# Patient Record
Sex: Female | Born: 1960 | Race: White | Hispanic: No | Marital: Single | Smoking: Never smoker
Health system: Southern US, Community
[De-identification: ages and names within clinical notes are randomized; demographics above are authoritative.]

## PROBLEM LIST (undated history)

## (undated) HISTORY — PX: BREAST SURGERY: SHX581

---

## 2013-10-09 ENCOUNTER — Emergency Department
Admission: EM | Admit: 2013-10-09 | Discharge: 2013-10-09 | Disposition: A | Discharge status: Home / Self Care | Payer: BC Managed Care – PPO | Source: Home / Self Care | Attending: Family Medicine | Admitting: Family Medicine

## 2013-10-09 ENCOUNTER — Encounter: Payer: Self-pay | Admitting: Emergency Medicine

## 2013-10-09 ENCOUNTER — Emergency Department (INDEPENDENT_AMBULATORY_CARE_PROVIDER_SITE_OTHER): Payer: BC Managed Care – PPO

## 2013-10-09 DIAGNOSIS — M545 Low back pain, unspecified: Secondary | ICD-10-CM

## 2013-10-09 DIAGNOSIS — M533 Sacrococcygeal disorders, not elsewhere classified: Secondary | ICD-10-CM

## 2013-10-09 LAB — POCT URINALYSIS DIP (MANUAL ENTRY)
BILIRUBIN UA: NEGATIVE
Glucose, UA: NEGATIVE
Ketones, POC UA: NEGATIVE
NITRITE UA: NEGATIVE
PH UA: 5.5 (ref 5–8)
PROTEIN UA: NEGATIVE
RBC UA: NEGATIVE
Spec Grav, UA: 1.01 (ref 1.005–1.03)
Urobilinogen, UA: 0.2 (ref 0–1)

## 2013-10-09 MED ORDER — METAXALONE 800 MG PO TABS: 800.0000 mg | ORAL_TABLET | Freq: Three times a day (TID) | ORAL | Status: DC

## 2013-10-09 MED ORDER — MELOXICAM 15 MG PO TABS: 15.0000 mg | ORAL_TABLET | Freq: Every day | ORAL | Status: DC

## 2013-10-09 NOTE — ED Notes (Signed)
States had sudden onset pain across lower back last evening; slept well; awoke with more intense back pain in area. Took ibuprofen today and used heat without relief.

## 2013-10-09 NOTE — ED Provider Notes (Signed)
CSN: [id number]     Arrival date & time 10/09/13  1412 History   First MD Initiated Contact with Patient 10/09/13 1438     Chief Complaint  Patient presents with  . Back Pain      HPI Comments: Patient noticed pain in her left lower back at about 6pm yesterday, which has become worse today.  The pain does not radiate.  She does not recall any injury or change in physical activities.  No bowel or bladder dysfunction.  No saddle numbness.  The pain is worse when supine and standing.  Patient is a 53 y.o. female presenting with back pain. The history is provided by the patient.  Back Pain Location:  Sacro-iliac joint Quality:  Aching Radiates to:  Does not radiate Pain severity:  Moderate Pain is:  Same all the time Onset quality:  Sudden Duration:  1 day Timing:  Constant Progression:  Worsening Chronicity:  New Relieved by:  Nothing Worsened by:  Standing and lying down Ineffective treatments:  NSAIDs Associated symptoms: no abdominal pain, no abdominal swelling, no bladder incontinence, no bowel incontinence, no dysuria, no fever, no leg pain, no numbness, no paresthesias, no pelvic pain, no perianal numbness, no tingling and no weakness   Risk factors: obesity     History reviewed. No pertinent past medical history. Past Surgical History  Procedure Laterality Date  . Breast surgery Left     benign tumor   Family History  Problem Relation Age of Onset  . Hyperlipidemia Mother    History  Substance Use Topics  . Smoking status: Never Smoker   . Smokeless tobacco: Not on file  . Alcohol Use: No   OB History   Grav Para Term Preterm Abortions TAB SAB Ect Mult Living                 Review of Systems  Constitutional: Negative for fever.  Gastrointestinal: Negative for abdominal pain and bowel incontinence.  Genitourinary: Negative for bladder incontinence, dysuria and pelvic pain.  Musculoskeletal: Positive for back pain.  Neurological: Negative for tingling,  weakness, numbness and paresthesias.    Allergies  Sulfa antibiotics  Home Medications   Current Outpatient Rx  Name  Route  Sig  Dispense  Refill  . meloxicam (MOBIC) 15 MG tablet   Oral   Take 1 tablet (15 mg total) by mouth daily. Take with food each morning   15 tablet   0   . metaxalone (SKELAXIN) 800 MG tablet   Oral   Take 1 tablet (800 mg total) by mouth 3 (three) times daily.   21 tablet   0    BP 106/76  Pulse 89  Temp(Src) 98.2 F (36.8 C) (Oral)  Resp 16  Ht 5\' 1"  (1.549 m)  Wt 172 lb (78.019 kg)  BMI 32.52 kg/m2  SpO2 98% Physical Exam  Nursing note and vitals reviewed. Constitutional: She is oriented to person, place, and time. She appears well-developed and well-nourished. No distress.  Patient is obese (BMI 32.5)  HENT:  Head: Normocephalic and atraumatic.  Eyes: Conjunctivae are normal. Pupils are equal, round, and reactive to light.  Neck: Normal range of motion.  Cardiovascular: Normal heart sounds.   Pulmonary/Chest: Breath sounds normal.  Abdominal: There is no tenderness.  Musculoskeletal:       Lumbar back: She exhibits decreased range of motion, tenderness and bony tenderness. She exhibits no swelling and no edema.       Back:  Back:  Can heel/toe walk and squat without difficulty.  Decreased forward flexion.  Tenderness over the left SI joint.  Straight leg raising test is negative.  Sitting knee extension test is negative.  Strength and sensation in the lower extremities is normal.  Patellar and achilles reflexes are normal   Neurological: She is alert and oriented to person, place, and time.  Skin: Skin is warm and dry. No rash noted.    ED Course  Procedures  none    Labs Reviewed  POCT URINALYSIS DIP (MANUAL ENTRY):  LEU trace, otherwise negative   Imaging Review Dg Lumbar Spine Complete  10/09/2013   CLINICAL DATA:  Right lower back pain and tenderness over the left sacroiliac joint.  EXAM: LUMBAR SPINE - COMPLETE 4+ VIEW   COMPARISON:  None.  FINDINGS: There is no fracture, subluxation, disc space narrowing, facet arthritis or bone destruction. There are minimal degenerative changes of the left sacroiliac joint.  IMPRESSION: Slight degenerative changes of the left sacroiliac joint. No significant abnormality of the lumbar spine.   Electronically Signed   By: Geanie CooleyJim  Maxwell M.D.   On: 10/09/2013 15:35     MDM   1. Pain of left sacroiliac joint    Begin Mobic, and Skelaxin.  Apply ice pack for 20 minutes every 3 to 4 hours.  Continue until pain decreases.  Begin stretching exercises. Followup with [facility] if not improving about two weeks.    Lattie HawStephen A Arjan Strohm, MD 10/10/13 [phone]

## 2013-10-09 NOTE — Discharge Instructions (Signed)
Apply ice pack for 20 minutes every 3 to 4 hours.  Continue until pain decreases.  Begin stretching exercises.   Sacroiliac Joint Dysfunction The sacroiliac joint connects the lower part of the spine (the sacrum) with the bones of the pelvis. CAUSES  Sometimes, there is no obvious reason for sacroiliac joint dysfunction. Other times, it may occur   During pregnancy.  After injury, such as:  Car accidents.  Sport-related injuries.  Work-related injuries.  Due to one leg being shorter than the other.  Due to other conditions that affect the joints, such as:  Rheumatoid arthritis.  Gout.  Psoriasis.  Joint infection (septic arthritis). SYMPTOMS  Symptoms may include:  Pain in the:  Lower back.  Buttocks.  Groin.  Thighs and legs.  Difficult sitting, standing, walking, lying, bending or lifting. DIAGNOSIS  A number of tests may be used to help diagnose the cause of sacroiliac joint dysfunction, including:  Imaging tests to look for other causes of pain, including:  MRI.  CT scan.  Bone scan.  Diagnostic injection: During a special x-ray (called fluoroscopy), a needle is put into the sacroiliac joint. A numbing medicine is injected into the joint. If the pain is improved or stopped, the diagnosis of sacroiliac joint dysfunction is more likely. TREATMENT  There are a number of types of treatment used for sacroiliac joint dysfunction, including:  Only take over-the-counter or prescription medicines for pain, discomfort, or fever as directed by your caregiver.  Medications to relax muscles.  Rest. Decreasing activity can help cut down on painful muscle spasms and allow the back to heal.  Application of heat or ice to the lower back may improve muscle spasms and soothe pain.  Brace. A special back brace, called a sacroiliac belt, can help support the joint while your back is healing.  Physical therapy can help teach comfortable positions and exercises to  strengthen muscles that support the sacroiliac joint.  Cortisone injections. Injections of steroid medicine into the joint can help decrease swelling and improve pain.  Hyaluronic acid injections. This chemical improves lubrication within the sacroiliac joint, thereby decreasing pain.  Radiofrequency ablation. A special needle is placed into the joint, where it burns away nerves that are carrying pain messages from the joint.  Surgery. Because pain occurs during movement of the joint, screws and plates may be installed in order to limit or prevent joint motion. HOME CARE INSTRUCTIONS   Take all medications exactly as directed.  Follow instructions regarding both rest and physical activity, to avoid worsening the pain.  Do physical therapy exercises exactly as prescribed. SEEK IMMEDIATE MEDICAL CARE IF:  You experience increasingly severe pain.  You develop new symptoms, such as numbness or tingling in your legs or feet.  You lose bladder or bowel control. Document Released: 10/05/2008 Document Revised: 10/01/2011 Document Reviewed: 10/05/2008 [facility] Patient Information 2014 [place], [place].

## 2013-10-12 ENCOUNTER — Telehealth: Payer: Self-pay | Admitting: *Deleted

## 2014-11-28 IMAGING — CR DG LUMBAR SPINE COMPLETE 4+V
5 series · 5 of 5 positions shown · non-contrast
Comparison: None.

CLINICAL DATA: Right lower back pain and tenderness over the left
sacroiliac joint.

EXAM:
LUMBAR SPINE - COMPLETE 4+ VIEW

[view not recorded (1 of 5)]
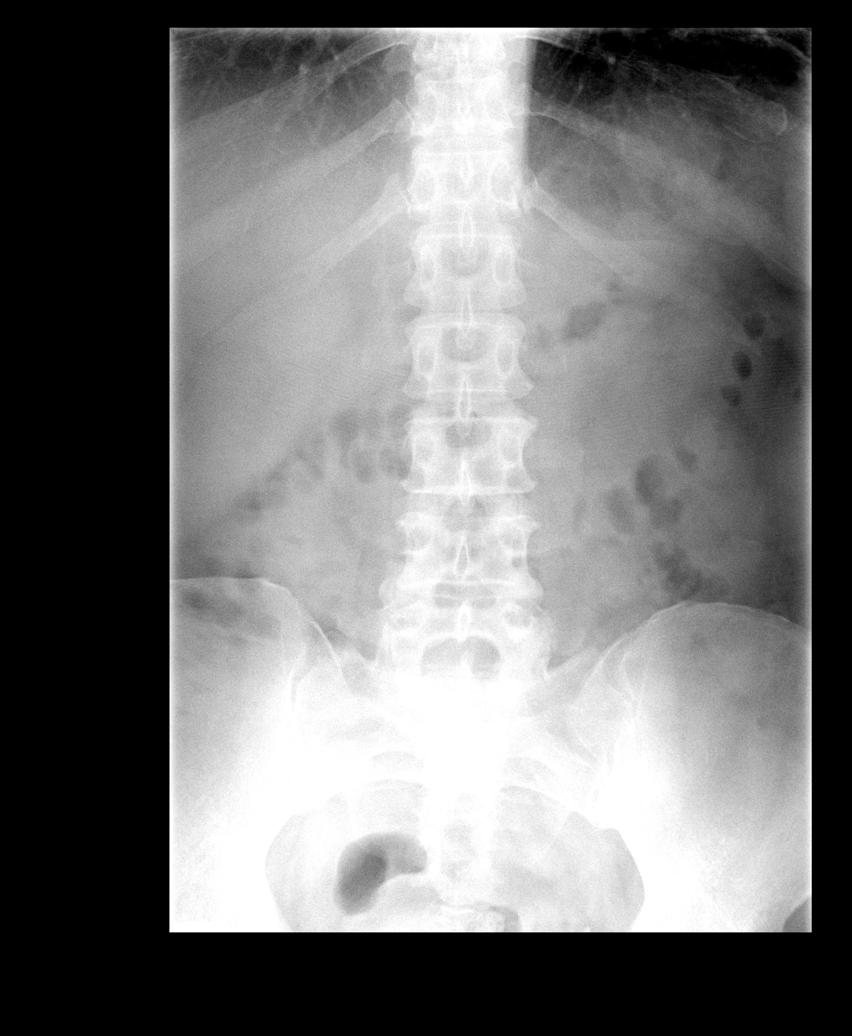

[view not recorded (2 of 5)]
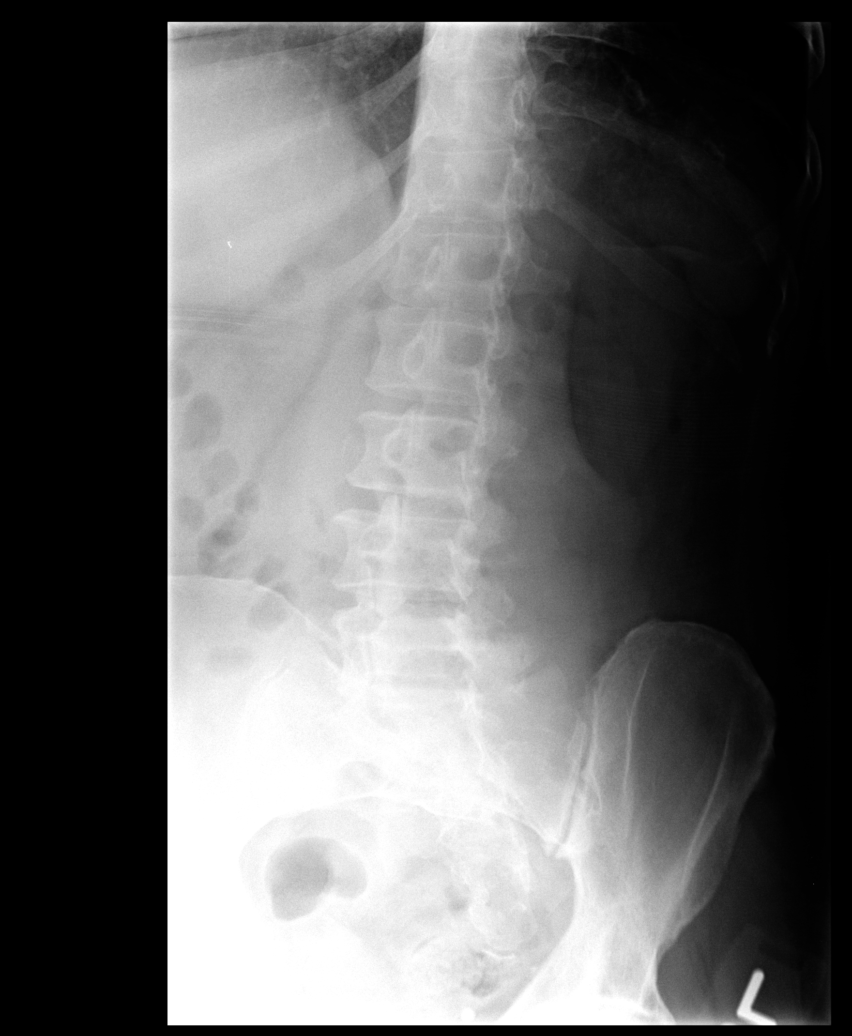

[view not recorded (3 of 5)]
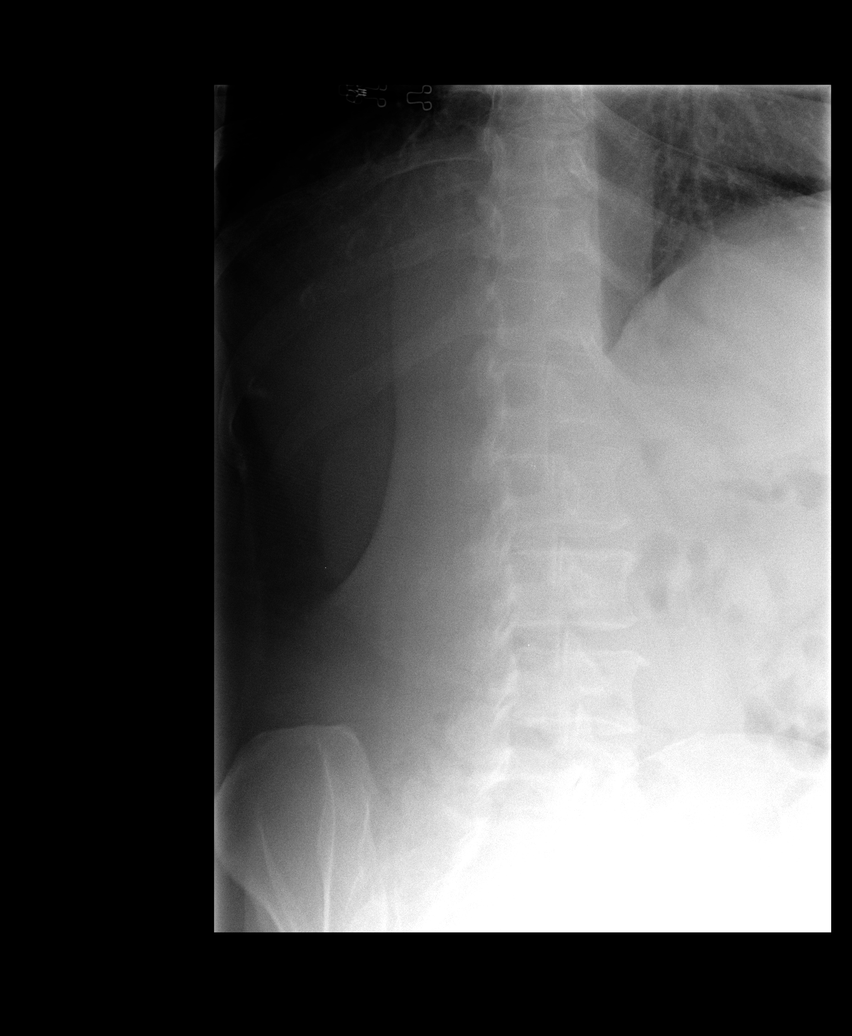

[view not recorded (4 of 5)]
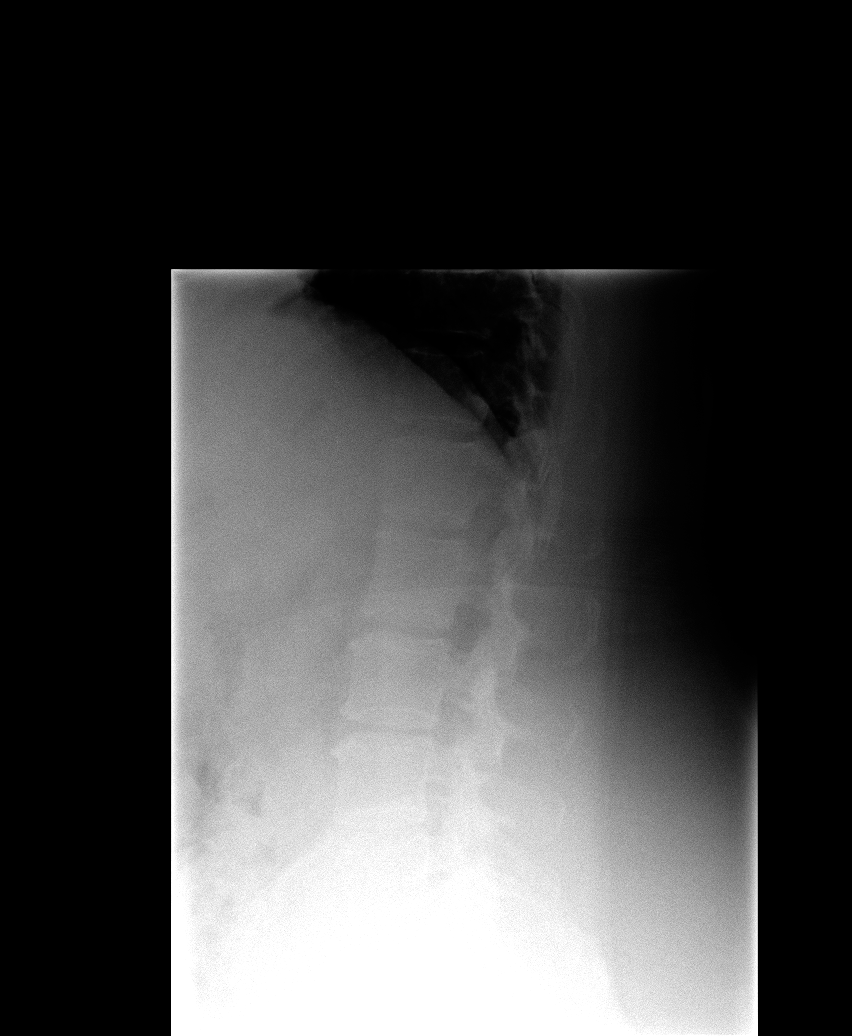

[view not recorded (5 of 5)]
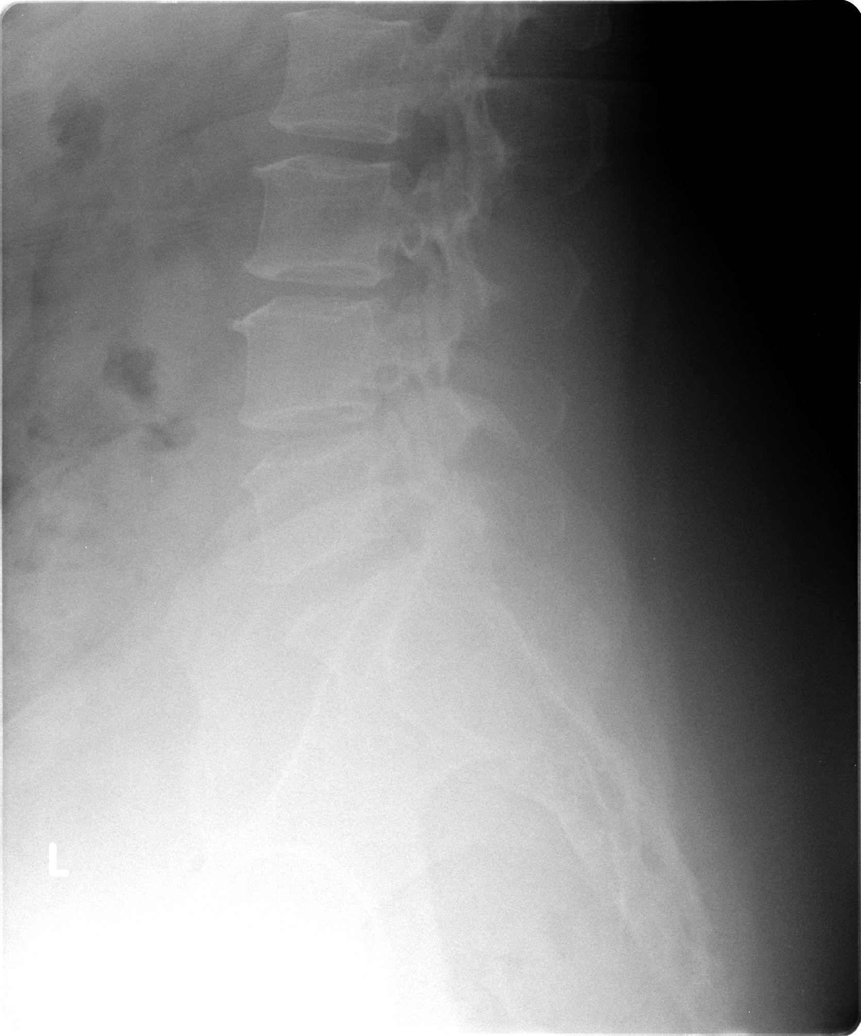

[5 of 5 positions shown; findings below may reference images not displayed]

FINDINGS: There is no fracture, subluxation, disc space narrowing, facet
arthritis or bone destruction. There are minimal degenerative
changes of the left sacroiliac joint.
IMPRESSION: Slight degenerative changes of the left sacroiliac joint. No
significant abnormality of the lumbar spine.

## 2016-07-28 ENCOUNTER — Emergency Department
Admission: EM | Admit: 2016-07-28 | Discharge: 2016-07-28 | Disposition: A | Discharge status: Home / Self Care | Payer: BC Managed Care – PPO | Source: Home / Self Care | Attending: Family Medicine | Admitting: Family Medicine

## 2016-07-28 DIAGNOSIS — J111 Influenza due to unidentified influenza virus with other respiratory manifestations: Secondary | ICD-10-CM

## 2016-07-28 DIAGNOSIS — R69 Illness, unspecified: Secondary | ICD-10-CM | POA: Diagnosis not present

## 2016-07-28 MED ORDER — OSELTAMIVIR PHOSPHATE 75 MG PO CAPS: 75.0000 mg | ORAL_CAPSULE | Freq: Two times a day (BID) | ORAL | 0 refills | Status: DC

## 2016-07-28 MED ORDER — BENZONATATE 200 MG PO CAPS: ORAL_CAPSULE | ORAL | 0 refills | Status: DC

## 2016-07-28 NOTE — Discharge Instructions (Signed)
Take plain guaifenesin (1200mg  extended release tabs such as Mucinex) twice daily, with plenty of water, for cough and congestion.  May add Pseudoephedrine (30mg , one or two every 4 to 6 hours) for sinus congestion.  Get adequate rest.   May use Afrin nasal spray (or generic oxymetazoline) twice daily for about 5 days and then discontinue.  Also recommend using saline nasal spray several times daily and saline nasal irrigation (AYR is a common brand).  Use Flonase nasal spray each morning after using Afrin nasal spray and saline nasal irrigation. Try warm salt water gargles for sore throat.  Stop all antihistamines for now, and other non-prescription cough/cold preparations. May take Ibuprofen 200mg , 3 or 4 tabs every 8 hours with food for fever, headache, body aches, etc.

## 2016-07-28 NOTE — ED Provider Notes (Signed)
[facility]    CSN: [id number] Arrival date & time: 07/28/16  1033     History   Chief Complaint Chief Complaint  Patient presents with  . Generalized Body Aches    HPI Amanda Drake is a 56 y.o. female.   Yesterday patient developed flu-like symptoms including myalgias, fever/chills, fatigue, and cough.  Also has mild nasal congestion and sore throat.  Cough is non-productive and somewhat worse at night.  No pleuritic pain or shortness of breath.    The history is provided by the patient.    History reviewed. No pertinent past medical history.  There are no active problems to display for this patient.   Past Surgical History:  Procedure Laterality Date  . BREAST SURGERY Left    benign tumor    OB History    No data available       Home Medications    Prior to Admission medications   Medication Sig Start Date End Date Taking? Authorizing Provider  benzonatate (TESSALON) 200 MG capsule Take one cap by mouth at bedtime as needed for cough.  May repeat in 4 to 6 hours 07/28/16   Lattie HawStephen A Tyreek Clabo, MD  metaxalone (SKELAXIN) 800 MG tablet Take 1 tablet (800 mg total) by mouth 3 (three) times daily. 10/09/13   Lattie HawStephen A Amdrew Oboyle, MD  oseltamivir (TAMIFLU) 75 MG capsule Take 1 capsule (75 mg total) by mouth every 12 (twelve) hours. 07/28/16   Lattie HawStephen A Bob Daversa, MD    Family History Family History  Problem Relation Age of Onset  . Hyperlipidemia Mother     Social History Social History  Substance Use Topics  . Smoking status: Never Smoker  . Smokeless tobacco: Never Used  . Alcohol use No     Allergies   Sulfa antibiotics   Review of Systems Review of Systems + sore throat + cough No pleuritic pain No wheezing + nasal congestion + post-nasal drainage No sinus pain/pressure No itchy/red eyes ? earache No hemoptysis No SOB + fever, + chills No nausea No vomiting No abdominal pain No diarrhea No urinary symptoms No skin rash +  fatigue + myalgias No headache Used OTC meds without relief   Physical Exam Triage Vital Signs ED Triage Vitals  Enc Vitals Group     BP 07/28/16 1102 134/83     Pulse Rate 07/28/16 1102 118     Resp --      Temp 07/28/16 1102 99.2 F (37.3 C)     Temp Source 07/28/16 1102 Oral     SpO2 07/28/16 1102 95 %     Weight 07/28/16 1103 188 lb (85.3 kg)     Height 07/28/16 1103 5\' 2"  (1.575 m)     Head Circumference --      Peak Flow --      Pain Score 07/28/16 1103 5     Pain Loc --      Pain Edu? --      Excl. in GC? --    No data found.   Updated Vital Signs BP 134/83 (BP Location: Left Arm)   Pulse 118   Temp 99.2 F (37.3 C) (Oral)   Ht 5\' 2"  (1.575 m)   Wt 188 lb (85.3 kg)   SpO2 95%   BMI 34.39 kg/m   Visual Acuity Right Eye Distance:   Left Eye Distance:   Bilateral Distance:    Right Eye Near:   Left Eye Near:    Bilateral Near:  Physical Exam Nursing notes and Vital Signs reviewed. Appearance:  Patient appears stated age, and in no acute distress Eyes:  Pupils are equal, round, and reactive to light and accomodation.  Extraocular movement is intact.  Conjunctivae are not inflamed  Ears:  Canals normal.  Tympanic membranes normal.  Nose:  Mildly congested turbinates.  No sinus tenderness.   Pharynx:  Normal Neck:  Supple.  Tender enlarged posterior/lateral nodes are palpated bilaterally  Lungs:  Clear to auscultation.  Breath sounds are equal.  Moving air well. Heart:  Regular rate and rhythm without murmurs, rubs, or gallops.  Abdomen:  Nontender without masses or hepatosplenomegaly.  Bowel sounds are present.  No CVA or flank tenderness.  Extremities:  No edema.  Skin:  No rash present.    UC Treatments / Results  Labs (all labs ordered are listed, but only abnormal results are displayed) Labs Reviewed - No data to display  EKG  EKG Interpretation None       Radiology No results found.  Procedures Procedures (including critical care  time)  Medications Ordered in UC Medications - No data to display   Initial Impression / Assessment and Plan / UC Course  I have reviewed the triage vital signs and the nursing notes.  Pertinent labs & imaging results that were available during my care of the patient were reviewed by me and considered in my medical decision making (see chart for details).  Clinical Course   Begin Tamiflu.  Prescription written for Benzonatate [facility]) to take at bedtime for night-time cough.  Take plain guaifenesin (1200mg  extended release tabs such as Mucinex) twice daily, with plenty of water, for cough and congestion.  May add Pseudoephedrine (30mg , one or two every 4 to 6 hours) for sinus congestion.  Get adequate rest.   May use Afrin nasal spray (or generic oxymetazoline) twice daily for about 5 days and then discontinue.  Also recommend using saline nasal spray several times daily and saline nasal irrigation (AYR is a common brand).  Use Flonase nasal spray each morning after using Afrin nasal spray and saline nasal irrigation. Try warm salt water gargles for sore throat.  Stop all antihistamines for now, and other non-prescription cough/cold preparations. May take Ibuprofen 200mg , 3 or 4 tabs every 8 hours with food for fever, headache, body aches, etc. Will write prophylactic Tamiflu for husband and son. Followup with Family Doctor if not improved in 5 days.     Final Clinical Impressions(s) / UC Diagnoses   Final diagnoses:  Influenza-like illness    New Prescriptions New Prescriptions   BENZONATATE (TESSALON) 200 MG CAPSULE    Take one cap by mouth at bedtime as needed for cough.  May repeat in 4 to 6 hours   OSELTAMIVIR (TAMIFLU) 75 MG CAPSULE    Take 1 capsule (75 mg total) by mouth every 12 (twelve) hours.     Lattie Haw, MD 08/14/16 [phone]

## 2016-07-28 NOTE — ED Triage Notes (Signed)
Patient c/o body aches, right ear pain. Low grade fever and cough. Patient states her cough is "wet" but non productive. Tried Alka seltzer cold and flu capsules and they worked well for a while. States her mother was diagnosed with the "beginning stages of the flu" last week.

## 2017-06-12 ENCOUNTER — Other Ambulatory Visit: Payer: Self-pay

## 2017-06-12 ENCOUNTER — Encounter: Payer: Self-pay | Admitting: *Deleted

## 2017-06-12 ENCOUNTER — Emergency Department
Admission: EM | Admit: 2017-06-12 | Discharge: 2017-06-12 | Disposition: A | Discharge status: Home / Self Care | Payer: BC Managed Care – PPO | Source: Home / Self Care | Attending: Emergency Medicine | Admitting: Emergency Medicine

## 2017-06-12 DIAGNOSIS — B001 Herpesviral vesicular dermatitis: Secondary | ICD-10-CM | POA: Diagnosis not present

## 2017-06-12 DIAGNOSIS — I889 Nonspecific lymphadenitis, unspecified: Secondary | ICD-10-CM

## 2017-06-12 MED ORDER — MUPIROCIN CALCIUM 2 % NA OINT: TOPICAL_OINTMENT | NASAL | 0 refills | Status: AC

## 2017-06-12 MED ORDER — VALACYCLOVIR HCL 1 G PO TABS: ORAL_TABLET | ORAL | 0 refills | Status: AC

## 2017-06-12 NOTE — Discharge Instructions (Addendum)
Apply your antibiotic ointment to the sore on your lip 3 times a day. Take Valtrex 2 tablets twice a day for one day.

## 2017-06-12 NOTE — ED Provider Notes (Addendum)
[facility]    CSN: [id number] Arrival date & time: 06/12/17  [id number]     History   Chief Complaint Chief Complaint  Patient presents with  . Lymphadenopathy    HPI Romilda GarretMichele Amico is a 56 y.o. female.  Patient enters with a 3 day history of painful sores at the corner of her mouth on the left. Yesterday she developed painful lymph glands in the left side of her neck. She was concerned because she had had a previous aspiration of a cyst on the left side of her thyroid. She has had no fever. She has no dental pain or sore throat. HPI  History reviewed. No pertinent past medical history.  There are no active problems to display for this patient.   Past Surgical History:  Procedure Laterality Date  . BREAST SURGERY Left    benign tumor    OB History    No data available       Home Medications    Prior to Admission medications   Not on File    Family History Family History  Problem Relation Age of Onset  . Hyperlipidemia Mother   . Thyroid disease Mother   . Lymphoma Sister     Social History Social History   Tobacco Use  . Smoking status: Never Smoker  . Smokeless tobacco: Never Used  Substance Use Topics  . Alcohol use: No  . Drug use: Not on file     Allergies   Sulfa antibiotics   Review of Systems Review of Systems  Constitutional: Negative.   HENT: Negative for dental problem, ear pain and sore throat.   Skin:       Patient has a painful cluster of blisters the lateral portion of her left lower lip and at the corner of the mouth on the left.     Physical Exam Triage Vital Signs ED Triage Vitals [06/12/17 1009]  Enc Vitals Group     BP 124/83     Pulse Rate 75     Resp 18     Temp 98.2 F (36.8 C)     Temp Source Oral     SpO2 96 %     Weight 189 lb (85.7 kg)     Height 5\' 2"  (1.575 m)     Head Circumference      Peak Flow      Pain Score 0     Pain Loc      Pain Edu?      Excl. in GC?    No data  found.  Updated Vital Signs BP 124/83 (BP Location: Left Arm)   Pulse 75   Temp 98.2 F (36.8 C) (Oral)   Resp 18   Ht 5\' 2"  (1.575 m)   Wt 189 lb (85.7 kg)   SpO2 96%   BMI 34.57 kg/m   Visual Acuity Right Eye Distance:   Left Eye Distance:   Bilateral Distance:    Right Eye Near:   Left Eye Near:    Bilateral Near:     Physical Exam  HENT:  Head: Normocephalic.  Right Ear: External ear normal.  Left Ear: External ear normal.  Nose: Nose normal.  Mouth/Throat: Oropharynx is clear and moist. No oropharyngeal exudate.  There is a 1 cm cluster of small vesicles at the corner of the mouth on the left primarily involving the left lower lip.  Neck: Normal range of motion. No tracheal deviation present. No thyromegaly present.  There are  2 1 x 1.5 cm nodes left side of the neck which are tender to touch and not fixed to the skin or underlying tissue.     UC Treatments / Results  Labs (all labs ordered are listed, but only abnormal results are displayed) Labs Reviewed - No data to display  EKG  EKG Interpretation None       Radiology No results found.  Procedures Procedures (including critical care time)  Medications Ordered in UC Medications - No data to display   Initial Impression / Assessment and Plan / UC Course  I have reviewed the triage vital signs and the nursing notes.  Pertinent labs & imaging results that were available during my care of the patient were reviewed by me and considered in my medical decision making (see chart for details).     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 She has lymphadenitis related to these fascicular lesions. Will treat the lesion on her lip with Bactroban ointment and given Valtrex 2 g twice a day for one day.  Final Clinical Impressions(s) / UC Diagnoses   Final diagnoses:  Cold sore  Lymphadenitis    ED Discharge Orders    None       Controlled Substance Prescriptions [place] Controlled  Substance Registry consulted? Not Applicable   Collene Gobbleaub, Dealva Lafoy A, MD 06/12/17 1043    Collene Gobbleaub, Nichols Corter A, MD 06/12/17 1120

## 2017-06-12 NOTE — ED Triage Notes (Signed)
Pt c/o swollen lymph nodes in the LT side of her neck and a fever blister on the LT side of her mouth x 1 day.  Reports recent mild URI.
